# Patient Record
Sex: Male | Born: 1950 | Race: White | Hispanic: No | Marital: Married | Smoking: Never smoker
Health system: Southern US, Community
[De-identification: ages and names within clinical notes are randomized; demographics above are authoritative.]

## PROBLEM LIST (undated history)

## (undated) DIAGNOSIS — I10 Essential (primary) hypertension: Secondary | ICD-10-CM

## (undated) DIAGNOSIS — C801 Malignant (primary) neoplasm, unspecified: Secondary | ICD-10-CM

---

## 2004-04-20 ENCOUNTER — Ambulatory Visit (HOSPITAL_COMMUNITY): Admission: RE | Admit: 2004-04-20 | Discharge: 2004-04-20 | Payer: Self-pay | Admitting: Internal Medicine

## 2004-05-09 ENCOUNTER — Inpatient Hospital Stay (HOSPITAL_COMMUNITY): Admission: RE | Admit: 2004-05-09 | Discharge: 2004-05-14 | Payer: Self-pay | Admitting: General Surgery

## 2004-06-22 ENCOUNTER — Encounter (HOSPITAL_COMMUNITY): Admission: RE | Admit: 2004-06-22 | Discharge: 2004-07-22 | Payer: Self-pay | Admitting: Oncology

## 2004-06-22 ENCOUNTER — Encounter: Admission: RE | Admit: 2004-06-22 | Discharge: 2004-06-22 | Payer: Self-pay | Admitting: Oncology

## 2007-10-15 ENCOUNTER — Ambulatory Visit: Payer: Self-pay | Admitting: Cardiology

## 2007-11-13 ENCOUNTER — Ambulatory Visit: Payer: Self-pay | Admitting: Cardiology

## 2007-12-12 ENCOUNTER — Ambulatory Visit: Payer: Self-pay | Admitting: Cardiology

## 2007-12-19 ENCOUNTER — Ambulatory Visit: Payer: Self-pay | Admitting: Cardiology

## 2011-03-14 NOTE — Assessment & Plan Note (Signed)
[facility]                          [facility] OFFICE NOTE   ARIS, EVEN                       MRN:          [id number]  DATE:12/12/2007                            DOB:          16-Dec-1950    CARDIOLOGIST:  Dr. Simona Huh.   PRIMARY CARE PHYSICIAN:  Dr. Samuel Jester.   REASON FOR VISIT:  88-month follow-up.   HISTORY OF PRESENT ILLNESS:  Mr. Tiger is a 60 year old male patient  with a history of hypertension, hyperlipidemia and colon cancer who saw  Dr. Diona Browner in December 2008.  At that point he was evaluated initially  for dyspnea.  He was set up for stress Cardiolite study as well as an  echocardiogram to further evaluate.  Unfortunately, the echocardiogram  was never performed.  His Cardiolite study was an adenosine study and  revealed an EF of 46%, global hypokinesis, medium fixed mid to basal  inferior defect associated mild hypokinesis consistent with prior  myocardial infarction.   The patient returns the office today for follow-up.  He continues note  progressively worsening edema in his lower extremities and continues to  note dyspnea with exertion.  He describes NYHA class 2B symptoms.  He  denies any orthopnea, PND.  Denies any syncope, near-syncope or  palpitations.  He denies chest pain.   CURRENT MEDICATIONS:  Include Exforge 10/320 mg daily, ranitidine 150 mg  b.i.d., alprazolam 1 mg three times a day.  Atenolol 50 mg daily,  Seroquel 100 mg daily.  The patient has been prescribed Bystolic 5 mg  daily by Dr. Charm Barges in the past but he prefers to take atenolol.  He was  taking atenolol but ran out of this and right now he is taking Bystolic  until he gets his new prescription of atenolol filled.  I explained to  him that he is to take one or the other but not both.   ALLERGIES:  No known drug allergies.   SOCIAL HISTORY:  Denies any tobacco abuse.   PHYSICAL EXAM:  He is a well-nourished, well-developed male,  in no acute  distress.  Blood pressure is 138/82, pulse 66 weight 215 pounds.  HEENT is normal.  Without JVD.  CARDIAC:  Normal S1, S2.  Regular rate and rhythm.  No murmurs.  LUNGS:  Clear to auscultation bilaterally.  No wheezing, rhonchi or  rales.  ABDOMEN:  Soft, nontender with normoactive bowel sounds.  No  organomegaly.  No HJR noted.  Extremities with 3+ edema bilaterally from his ankles up to his knees.  No presacral edema noted.  The patient denies scrotal edema.   IMPRESSION:  1. Dyspnea with exertion.  Abnormal adenosine Cardiolite January 2009      revealing EF of 46% and prior inferior myocardial infarction.  2. Peripheral edema.  3. Hypertension.  4. Hyperlipidemia.  5. Colon cancer.  Status post right hemicolectomy.  Recent evidence of      colonic polyps noted - the patient needs repeat colonoscopy;      pending.  6. Family history of CAD.   DISCUSSION:  Mr. Xia returns office  day for follow-up on his  Cardiolite study.  This was abnormal with evidence of an EF of 46% and  prior inferior scar.  He clearly has evidence of volume overload on exam  with significant pedal edema.  It is certainly possible that some of his  edema may be explained by amlodipine.  However, he does have dyspnea  with exertion.  His lungs are clear.  He is certainly at risk for CAD.  I discussed the case further with Dr. Diona Browner.   PLAN:  1. Initiate Lasix 40 mg daily and K-Dur 20 mEq daily.  2. Recheck a CMET, CBC, TSH, urinalysis and chest x-ray.  3. Repeat BMET in one weeks' time to reassess renal function and      potassium.  4. Proceed with 2-D echocardiogram to assess his LV function, valvular      status, right-sided heart pressures.  5. We will see him back in 3 to 4 weeks to follow-up with Dr. Diona Browner      and myself.  If he has evidence of LV dysfunction on 2-D      echocardiogram confirmed, he will likely need to proceed to cardiac      catheterization.  6. He has been  asked to start enteric coated aspirin 81 mg daily.      Tereso Newcomer, PA-C  Electronically Signed      Jonelle Sidle, MD  Electronically Signed   SW/MedQ  DD: 12/12/2007  DT: 12/13/2007  Job #: [id number]   cc:   Samuel Jester

## 2011-03-14 NOTE — Assessment & Plan Note (Signed)
[facility]                          [facility]ICE NOTE   Troy Terry, Troy Terry                       MRN:          [id number]  DATE:10/15/2007                            DOB:          [birth year]    REASON FOR CONSULTATION:  History of chest pain and shortness of breath.   HISTORY OF PRESENT ILLNESS:  Troy Terry is a 60 year old male with  available history suggesting longstanding hypertension, hyperlipidemia  with previous LDL cholesterol of 115 noted in September of this year,  and no definite history of cardiovascular disease or myocardial  infarction.  He reports a fairly longstanding several year history of  dyspnea on exertion.  He states that he becomes short of breath when he  walks 20 yards to his mailbox although does not have to stop.  He has no  typical exertional chest pain, describing only sporadic episodes of  brief (just a few seconds) dull chest discomfort.  This has not been  noted in any type of progressive fashion.  He states that he has been  under quite a bit of stress at home and also states that he has been  feeling fatigued associated with decreased energy, decreased appetite  and some anhedonia.  He does report that he has been treated for  depression by Dr. Charm Barges.  Today his electrocardiogram shows sinus  rhythm with nonspecific T wave changes and decreased anterior R wave  progression, although no frank Q waves.   ALLERGIES:  NO KNOWN DRUG ALLERGIES.   PRESENT MEDICATIONS:  1. Exforge 10/320 mg p.o. daily.  2. Ranitidine 150 mg p.o. b.i.d.  3. Alprazolam 1 mg p.o. t.i.d.  4. Atenolol 50 mg p.o. daily.  5. Phenergan.  6. Percocet p.r.n.   PAST MEDICAL HISTORY:  As outlined above.  Patient reports appendectomy  in 1995, also right hemicolectomy with removal of cancerous cecal mass  in 2005.  He reports that he has some other recurrent areas that were  noted recently following a double contrast barium study.   He reports  that he is to see Dr. Gabriel Cirri.  Also reports history of previous back  fractures and sleep apnea.   SOCIAL HISTORY:  Patient is married, he has 2 children.  He is disabled  since 23.  Denies any active tobacco or alcohol use.  Does not  exercise regularly.   FAMILY HISTORY:  Was reviewed, significant for heart disease in the  patient's father beginning in his 68s, he died at age 22 with diabetes  and heart disease.  Patient's mother is alive, age 37, living in a  nursing home.  He does have a brother that died at age 29 with cancer.   REVIEW OF SYSTEMS:  As described in the history of present illness.  Patient reports occasional reflux, arthritic pain, anxiety, depression,  seasonal allergies, constipation, hiatal hernia.   EXAMINATION:  Blood pressure is 150/90, heart rate is 60 and regular,  weight is 210 pounds.  He is an overweight male appearing older than his  stated age, no acute distress.  HEENT:  Conjunctivae and lids  grossly normal, oropharynx clear.  NECK:  Supple, no elevated jugular venous pressure or loud bruits, no  thyromegaly.  LUNGS:  Generally clear without labored breathing or wheezing.  CARDIAC:  Reveals a regular rate and rhythm, no loud systolic murmur or  S3 gallop, no pericardial rub.  ABDOMEN:  Soft, nontender, normoactive bowel sounds.  EXTREMITIES:  Exhibit fairly chronic, firm appearing edema, 2+.  Distal  pulse are 2+.  SKIN:  Otherwise warm and dry.  MUSCULOSKELETAL:  No kyphosis is noted.  NEUROPSYCHIATRIC:  Patient is alert and oriented x3.   IMPRESSION AND RECOMMENDATION:  1. Atypical chest pain with longer standing dyspnea on exertion in a      60 year old male with apparent longstanding hypertension, family      history of premature cardiovascular disease and hyperlipidemia.      Electrocardiogram is non specific.  He has not undergone any prior      cardiac risk stratification and our plan will be to proceed with an       adenosine Cardiolite and a 2D echocardiogram for further      evaluation.  I will then have him come back to the office for      further discussion and review.  2. Further plans to follow.     Jonelle Sidle, MD  Electronically Signed    SGM/MedQ  DD: 10/15/2007  DT: 10/15/2007  Job #: [id number]

## 2011-03-17 NOTE — H&P (Signed)
Troy Terry, FREDIN                          ACCOUNT NO.:  [id number]   MEDICAL RECORD NO.:  [id number]                  PATIENT TYPE:  AMB   LOCATION:                                       FACILITY:  [facility]   PHYSICIAN:  Dalia Heading, M.D.               DATE OF BIRTH:  15-Feb-1951   DATE OF ADMISSION:  DATE OF DISCHARGE:                                HISTORY & PHYSICAL   CHIEF COMPLAINT/>  Cecal neoplasm.   HISTORY OF PRESENT ILLNESS:  Patient is a 60 year old white male who is  referred for evaluation and treatment of a cecal neoplasm.  He underwent  colonoscopy by Dr. Kendell Bane for Hemoccult positive stools.  He was found to  have a neoplasm in the cecum.  Biopsy results were negative for malignancy.  The neoplasm could not be removed with the colonoscopy.  No family history  of colon carcinoma is noted.  No weight loss or bowel pain is noted.  No  diarrhea, constipation, bloating, nausea or vomiting have been noted.   PAST MEDICAL HISTORY:  Includes hypertension.   PAST SURGICAL HISTORY:  Appendectomy.   CURRENT MEDICATIONS:  1. Lotrel 10/20 mg p.o. q.d.  2. Diovan 165 mg p.o. q.d.  3. Atenolol 50 mg p.o. q.d.  4. K-Dur 10 mEq p.o. q.d.  5. Percocet as needed.  6. Xanax as needed.   ALLERGIES:  CODEINE.   REVIEW OF SYSTEMS:  Noncontributory.   PHYSICAL EXAMINATION:  VITAL SIGNS:  He is afebrile.  Vital signs are  stable.  GENERAL:  Patient is a well-developed and well-nourished white male in no  acute distress.  NECK:  Supple without lymphadenopathy.  LUNGS:  Clear to auscultation with equal breath sounds bilaterally.  HEART:  Regular rate and rhythm without S3, S4, or murmurs.  ABDOMEN:  Soft, nontender, nondistended.  No hepatosplenomegaly, masses, or  hernias.   IMPRESSION:  Cecal neoplasm, undetermined nature.   PLAN:  Patient is scheduled for a right hemicolectomy on May 09, 2004.  The  risks and benefits of the procedure, including bleeding, infection,  the  possibility of a blood transfusion, and the possibility of cardiopulmonary  difficulties were fully explained to the patient, who gave informed consent.     ___________________________________________                                         Dalia Heading, M.D.   MAJ/MEDQ  D:  04/21/2004  T:  04/21/2004  Job:  [id number]   cc:   R. Roetta Sessions, M.D.  [address]  [place]  [place] [address]  Fax: [phone]   [address]  [place]  [place] [address]  Fax: [phone]

## 2011-03-17 NOTE — Op Note (Signed)
NAMESANTIAGO, Troy Terry                          ACCOUNT NO.:  [id number]   MEDICAL RECORD NO.:  [id number]                   PATIENT TYPE:  AMB   LOCATION:  DAY                                  FACILITY:  [facility]   PHYSICIAN:  R. Roetta Sessions, M.D.              DATE OF BIRTH:  1951-01-15   DATE OF PROCEDURE:  04/20/2004  DATE OF DISCHARGE:                                 OPERATIVE REPORT   PROCEDURE:  Colonoscopy with snare polypectomy with biopsy.   INDICATIONS FOR PROCEDURE:  The patient is a 60 year old gentleman, referred  for colonoscopy.  Apparently, he was Hemoccult positive on digital rectal  exam in the emergency department recently when he was evaluated for  hypokalemia.  He has never had his lower GI tract evaluated.  There is no  family history of colorectal neoplasia.  Colonoscopy is now being done.  This approach has been discussed with the patient at length.  Potential  risks, benefits, and alternatives have been reviewed, questions answered.  Please see my handwritten H&P.   PROCEDURE NOTE:  O2 saturations, blood pressure, pulse, respirations were  monitored throughout the entire procedure.   CONSCIOUS SEDATION:  1. Versed 6 mg IV.  2. Demerol 100 IV in divided doses.   INSTRUMENT:  Olympus video chip system.   FINDINGS:  Digital rectal exam revealed no abnormalities.  Prep was good.   RECTUM:  Examination of rectal mucosa including retroflexed view of the anal  verge revealed no abnormalities.   COLON:  Colonic mucosa was surveyed from the rectosigmoid junction through  the left transverse and right colon to the area of appendiceal orifice,  ileocecal valve, and cecum.  The appendiceal orifice was well seen and  photographed.  From this leve, the scope was slowly withdrawn.  All  previously mentioned mucosal surfaces were again seen.  The following  abnormalities were found:  1. Left-sided transverse diverticula.  2. 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  Rough     dimensions would be approximately 5 x 7 cm.  The ileocecal valve itself     was obscured by the infiltrating tumor.  There were also numerous polyps     in the right colon with the largest being 1.5 cm at the hepatic flexure.     The neoplasm was biopsied multiple times.  It was hard and friable.     Multiple polyps in the right colon were either cold or hot snared.  Not     all of the polyp fragments were recovered.  However, there was no     evidence of polyp or neoplastic disease distal to the hepatic flexure.     The remainder of the colonic mucosa appeared normal.  The patient     tolerated the procedure well and was reacted in endoscopy.   IMPRESSION:  1. Normal rectum.  2. Left-sided transverse diverticula.  3.  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  4. Multiple right colon polyps with largest polyp at the hepatic flexure     removed with the snare.   RECOMMENDATIONS:  1. The patient will need to get his right colon out in the near future.  We     will discuss surgical consultation with him.  2. Baseline CEA and chem-20.  3. CBC today.  4. Further recommendations to follow.      ___________________________________________                                            Troy Terry, M.D.   RMR/MEDQ  D:  04/20/2004  T:  04/20/2004  Job:  [id number]   cc:   [address]  [place]  [place] [address]  Fax: [phone]

## 2011-03-17 NOTE — Op Note (Signed)
Troy Terry, Troy Terry                          ACCOUNT NO.:  [id number]   MEDICAL RECORD NO.:  [id number]                   PATIENT TYPE:  AMB   LOCATION:  DAY                                  FACILITY:  [facility]   PHYSICIAN:  Dalia Heading, M.D.               DATE OF BIRTH:  [birth year]   DATE OF PROCEDURE:  05/09/2004  DATE OF DISCHARGE:                                 OPERATIVE REPORT   PREOPERATIVE DIAGNOSIS:  Cecal mass, umbilical hernia.   POSTOPERATIVE DIAGNOSIS:  Cecal mass, umbilical hernia.   PROCEDURE:  Right hemicolectomy, umbilical herniorrhaphy.   SURGEON:  Dr. Franky Macho.   ANESTHESIA:  General endotracheal.   INDICATIONS:  The patient is a 60 year old white male who was found to have  a cecal mass on colonoscopy by Dr. Loura Back.  Initial biopsies were negative for  malignancy.  Due to the size of the mass and the inability to excise it  endoscopically, the patient now comes to the operating room for a right  hemicolectomy with terminal ileum removal.  In addition, he has an umbilical  hernia which is symptomatic.  This will also be repaired at the time of  surgery.  The risks and benefits of the procedures including bleeding,  infection, the possibility of blood transfusion, cardiopulmonary  difficulties, and a strong possibility that this is a malignancy were fully  explained to the patient, gaining informed consent.   PROCEDURE:  The patient was placed in the supine position.  After induction  of general endotracheal anesthesia, the abdomen was prepped and draped using  the usual sterile technique with Betadine.  Surgical site confirmation was  performed.   A midline incision was made from the umbilicus inferiorly.  This was taken  down to the fascia.  The peritoneal cavity was entered without difficulty.  The liver, stomach, spleen, gallbladder and small intestine were all within  normal limits.  A palpable mass was noted within the cecal region.  The rest  of  the colon was within normal limits.  The right colon was mobilized around  the line of Toldt.  The mobilization occurred around to the middle colic  artery.  A GIA stapler was placed across the terminal ileum and fired.  This  was likewise done to the proximal transverse colon.  The right colon  mesentery was divided and ligated using 2-0 silk ties as well as an LDS  stapler.  The specimen was sent to pathology for further examination.  A  side-to-side ileocolic anastomosis was then performed using a GIA 70  stapler.  The enterotomy was closed using a TA 60 stapler.  The staple line  was bolstered using 3-0 silk Lembert sutures.  A widely patent anastomosis  was found.  The mesenteric defect was closed using an 0 chromic gut running  suture.  The abdominal cavity was then copiously irrigated with normal  saline.  Any  bleeding was controlled using Bovie electrocautery and clips.  The bowel was returned into the abdominal cavity in an orderly fashion.   All surgical personnel then changed their gloves.  The fascia was  reapproximated using a looped 0 Novofil running suture.  The umbilical  hernia was repaired using an 0 Novofil interrupted suture.  The subcutaneous  layer was irrigated with normal saline and the skin was closed using  staples.  Betadine ointment and a dry sterile dressing were applied.   All tape and needle counts were correct at the end of the procedure.  The  patient was extubated in the operating room and went back to the recovery  room awake and in stable condition.   COMPLICATIONS:  None.   SPECIMENS:  Right colon.   BLOOD LOSS:  300 cc.      ___________________________________________                                            Dalia Heading, M.D.   MAJ/MEDQ  D:  05/09/2004  T:  05/09/2004  Job:  [id number]   cc:   [address]  [place]  [place] [address]  Fax: [phone]   R. Roetta Sessions, M.D.  [address]  [place]  [place] [address]  Fax:  [phone]

## 2011-03-17 NOTE — Discharge Summary (Signed)
Troy Terry, Troy Terry                          ACCOUNT NO.:  [id number]   MEDICAL RECORD NO.:  [id number]                   PATIENT TYPE:  INP   LOCATION:  A306                                 FACILITY:  [facility]   PHYSICIAN:  Dalia Heading, M.D.               DATE OF BIRTH:  [birth year]   DATE OF ADMISSION:  05/09/2004  DATE OF DISCHARGE:  05/14/2004                                 DISCHARGE SUMMARY   HOSPITAL COURSE SUMMARY:  Patient is a 60 year old white male who was found  on colonoscopy by Dr. Augusto Gamble to have a cecal mass.  Initial biopsies  were negative for malignancy.  Due to the size of the mass, the patient was  referred to my care for a right hemicolectomy.  This was performed on May 09, 2004.  His postoperative course was remarkable for anemia.  He did  require several blood transfusions.  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  Thirteen lymph nodes were negative for  malignancy.  Staging was at T2, N0, M0.  The patient's diet was advanced  without difficulty once his bowel function returned.  His preoperative CEA  was within normal limits.   Patient is being discharged home on May 14, 2004 in good improving  condition.   DISCHARGE INSTRUCTIONS:  Patient will follow up with Dr. Franky Macho on  May 17, 2004.   DISCHARGE MEDICATIONS INCLUDE:  1. Percocet one tablet p.o. q.6h. p.r.n. pain.  2. He is to resume all his other medications as previously prescribed.   PRINCIPAL DIAGNOSES:  1. Adenocarcinoma of the cecum.  2. Hypertension.  3. Anemia, resolved.  4. Anxiety.   PRINCIPAL PROCEDURE:  Right hemicolectomy by Dr. Franky Macho on May 09, 2004.     ___________________________________________                                         Dalia Heading, M.D.   MAJ/MEDQ  D:  05/14/2004  T:  05/14/2004  Job:  [id number]   cc:   [address]  [place]  [place] [address]  Fax: [phone]   R. Roetta Sessions, M.D.  [address]  [place]  [place] [address]  Fax: [phone]

## 2014-08-01 ENCOUNTER — Telehealth: Payer: Self-pay | Admitting: Cardiology
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
## 2014-10-12 ENCOUNTER — Emergency Department (HOSPITAL_COMMUNITY): Payer: PRIVATE HEALTH INSURANCE

## 2014-10-12 ENCOUNTER — Observation Stay (HOSPITAL_COMMUNITY)
Admission: EM | Admit: 2014-10-12 | Discharge: 2014-10-14 | Disposition: A | Discharge status: Home / Self Care | Payer: PRIVATE HEALTH INSURANCE | Attending: Internal Medicine | Admitting: Internal Medicine

## 2014-10-12 ENCOUNTER — Encounter (HOSPITAL_COMMUNITY): Payer: Self-pay

## 2014-10-12 DIAGNOSIS — E876 Hypokalemia: Secondary | ICD-10-CM | POA: Insufficient documentation

## 2014-10-12 DIAGNOSIS — Z79899 Other long term (current) drug therapy: Secondary | ICD-10-CM | POA: Diagnosis not present

## 2014-10-12 DIAGNOSIS — Y92410 Unspecified street and highway as the place of occurrence of the external cause: Secondary | ICD-10-CM | POA: Diagnosis not present

## 2014-10-12 DIAGNOSIS — Y998 Other external cause status: Secondary | ICD-10-CM | POA: Insufficient documentation

## 2014-10-12 DIAGNOSIS — Z85038 Personal history of other malignant neoplasm of large intestine: Secondary | ICD-10-CM | POA: Insufficient documentation

## 2014-10-12 DIAGNOSIS — R55 Syncope and collapse: Principal | ICD-10-CM | POA: Diagnosis present

## 2014-10-12 DIAGNOSIS — Y9389 Activity, other specified: Secondary | ICD-10-CM | POA: Diagnosis not present

## 2014-10-12 DIAGNOSIS — I10 Essential (primary) hypertension: Secondary | ICD-10-CM

## 2014-10-12 DIAGNOSIS — S29001A Unspecified injury of muscle and tendon of front wall of thorax, initial encounter: Secondary | ICD-10-CM | POA: Diagnosis not present

## 2014-10-12 DIAGNOSIS — G8929 Other chronic pain: Secondary | ICD-10-CM | POA: Insufficient documentation

## 2014-10-12 DIAGNOSIS — F319 Bipolar disorder, unspecified: Secondary | ICD-10-CM | POA: Diagnosis not present

## 2014-10-12 HISTORY — DX: Essential (primary) hypertension: I10

## 2014-10-12 HISTORY — DX: Malignant (primary) neoplasm, unspecified: C80.1

## 2014-10-12 LAB — COMPREHENSIVE METABOLIC PANEL
ALK PHOS: 77 U/L (ref 39–117)
ALT: 7 U/L (ref 0–53)
AST: 12 U/L (ref 0–37)
Albumin: 3.7 g/dL (ref 3.5–5.2)
Anion gap: 13 (ref 5–15)
BILIRUBIN TOTAL: 0.7 mg/dL (ref 0.3–1.2)
BUN: 8 mg/dL (ref 6–23)
CALCIUM: 9 mg/dL (ref 8.4–10.5)
CHLORIDE: 97 meq/L (ref 96–112)
CO2: 30 meq/L (ref 19–32)
Creatinine, Ser: 1.12 mg/dL (ref 0.50–1.35)
GFR, EST AFRICAN AMERICAN: 78 mL/min — AB (ref >90)
GFR, EST NON AFRICAN AMERICAN: 68 mL/min — AB (ref >90)
Glucose, Bld: 120 mg/dL — ABNORMAL HIGH (ref 70–99)
POTASSIUM: 2.6 meq/L — AB (ref 3.7–5.3)
Sodium: 140 mEq/L (ref 137–147)
Total Protein: 6.8 g/dL (ref 6.0–8.3)

## 2014-10-12 LAB — URINALYSIS, ROUTINE W REFLEX MICROSCOPIC
Bilirubin Urine: NEGATIVE
GLUCOSE, UA: NEGATIVE mg/dL
HGB URINE DIPSTICK: NEGATIVE
KETONES UR: NEGATIVE mg/dL
Leukocytes, UA: NEGATIVE
NITRITE: NEGATIVE
PH: 6 (ref 5.0–8.0)
PROTEIN: NEGATIVE mg/dL
Specific Gravity, Urine: 1.012 (ref 1.005–1.030)
UROBILINOGEN UA: 0.2 mg/dL (ref 0.0–1.0)

## 2014-10-12 LAB — CBC WITH DIFFERENTIAL/PLATELET
Basophils Absolute: 0 10*3/uL (ref 0.0–0.1)
Basophils Relative: 1 % (ref 0–1)
EOS ABS: 0.1 10*3/uL (ref 0.0–0.7)
EOS PCT: 2 % (ref 0–5)
HEMATOCRIT: 34.2 % — AB (ref 39.0–52.0)
HEMOGLOBIN: 11.8 g/dL — AB (ref 13.0–17.0)
LYMPHS PCT: 30 % (ref 12–46)
Lymphs Abs: 1.8 10*3/uL (ref 0.7–4.0)
MCH: 31.2 pg (ref 26.0–34.0)
MCHC: 34.5 g/dL (ref 30.0–36.0)
MCV: 90.5 fL (ref 78.0–100.0)
Monocytes Absolute: 0.5 10*3/uL (ref 0.1–1.0)
Monocytes Relative: 8 % (ref 3–12)
NEUTROS ABS: 3.7 10*3/uL (ref 1.7–7.7)
Neutrophils Relative %: 59 % (ref 43–77)
PLATELETS: 261 10*3/uL (ref 150–400)
RBC: 3.78 MIL/uL — AB (ref 4.22–5.81)
RDW: 12.4 % (ref 11.5–15.5)
WBC: 6.1 10*3/uL (ref 4.0–10.5)

## 2014-10-12 LAB — TROPONIN I: Troponin I: <0.3 ng/mL (ref <0.30)

## 2014-10-12 LAB — RAPID URINE DRUG SCREEN, HOSP PERFORMED
Amphetamines: NOT DETECTED
Barbiturates: NOT DETECTED
Benzodiazepines: POSITIVE — AB
COCAINE: NOT DETECTED
OPIATES: NOT DETECTED
Tetrahydrocannabinol: NOT DETECTED

## 2014-10-12 LAB — TSH: TSH: 4.61 u[IU]/mL — AB (ref 0.350–4.500)

## 2014-10-12 LAB — CBG MONITORING, ED: GLUCOSE-CAPILLARY: 116 mg/dL — AB (ref 70–99)

## 2014-10-12 LAB — PHOSPHORUS: PHOSPHORUS: 3 mg/dL (ref 2.3–4.6)

## 2014-10-12 LAB — MAGNESIUM: MAGNESIUM: 1.8 mg/dL (ref 1.5–2.5)

## 2014-10-12 MED ORDER — PANTOPRAZOLE SODIUM 40 MG PO TBEC
40.0000 mg | DELAYED_RELEASE_TABLET | Freq: Every day | ORAL | Status: DC
  Administered 2014-10-13 – 2014-10-14 (×2): 40 mg via ORAL
  Filled 2014-10-12 (×2): qty 1

## 2014-10-12 MED ORDER — MORPHINE SULFATE 2 MG/ML IJ SOLN
1.0000 mg | INTRAMUSCULAR | Status: DC | PRN
  Administered 2014-10-13 (×3): 1 mg via INTRAVENOUS
  Filled 2014-10-12 (×3): qty 1

## 2014-10-12 MED ORDER — DOCUSATE SODIUM 100 MG PO CAPS
100.0000 mg | ORAL_CAPSULE | Freq: Two times a day (BID) | ORAL | Status: DC
  Administered 2014-10-14: 100 mg via ORAL
  Filled 2014-10-12 (×3): qty 1

## 2014-10-12 MED ORDER — VITAMIN B-1 100 MG PO TABS
100.0000 mg | ORAL_TABLET | Freq: Every day | ORAL | Status: DC
  Administered 2014-10-13 – 2014-10-14 (×2): 100 mg via ORAL
  Filled 2014-10-12 (×2): qty 1

## 2014-10-12 MED ORDER — OLANZAPINE 10 MG PO TABS
10.0000 mg | ORAL_TABLET | Freq: Every day | ORAL | Status: DC
  Filled 2014-10-12: qty 1

## 2014-10-12 MED ORDER — LEVOTHYROXINE SODIUM 25 MCG PO TABS
25.0000 ug | ORAL_TABLET | Freq: Every day | ORAL | Status: DC
  Administered 2014-10-13 – 2014-10-14 (×2): 25 ug via ORAL
  Filled 2014-10-12 (×2): qty 1

## 2014-10-12 MED ORDER — ACETAMINOPHEN 500 MG PO TABS
1000.0000 mg | ORAL_TABLET | Freq: Once | ORAL | Status: AC
  Administered 2014-10-12: 1000 mg via ORAL
  Filled 2014-10-12: qty 2

## 2014-10-12 MED ORDER — POTASSIUM CHLORIDE CRYS ER 20 MEQ PO TBCR
40.0000 meq | EXTENDED_RELEASE_TABLET | Freq: Once | ORAL | Status: AC
  Administered 2014-10-12: 40 meq via ORAL
  Filled 2014-10-12: qty 2

## 2014-10-12 MED ORDER — ASPIRIN EC 325 MG PO TBEC
325.0000 mg | DELAYED_RELEASE_TABLET | Freq: Every day | ORAL | Status: DC
  Administered 2014-10-12 – 2014-10-14 (×3): 325 mg via ORAL
  Filled 2014-10-12 (×3): qty 1

## 2014-10-12 MED ORDER — CLONIDINE HCL 0.2 MG PO TABS
0.2000 mg | ORAL_TABLET | Freq: Two times a day (BID) | ORAL | Status: DC
  Administered 2014-10-12 – 2014-10-14 (×4): 0.2 mg via ORAL
  Filled 2014-10-12 (×4): qty 1

## 2014-10-12 MED ORDER — SODIUM CHLORIDE 0.9 % IV SOLN
INTRAVENOUS | Status: DC
  Administered 2014-10-12: 22:00:00 via INTRAVENOUS

## 2014-10-12 MED ORDER — CARVEDILOL 25 MG PO TABS
25.0000 mg | ORAL_TABLET | Freq: Two times a day (BID) | ORAL | Status: DC
  Administered 2014-10-13 – 2014-10-14 (×3): 25 mg via ORAL
  Filled 2014-10-12 (×3): qty 1

## 2014-10-12 MED ORDER — SODIUM CHLORIDE 0.9 % IJ SOLN: 3.0000 mL | Freq: Two times a day (BID) | INTRAMUSCULAR | Status: DC

## 2014-10-12 MED ORDER — IRBESARTAN 150 MG PO TABS
300.0000 mg | ORAL_TABLET | Freq: Every day | ORAL | Status: DC
  Administered 2014-10-13 – 2014-10-14 (×2): 300 mg via ORAL
  Filled 2014-10-12 (×2): qty 2

## 2014-10-12 MED ORDER — POTASSIUM CHLORIDE CRYS ER 20 MEQ PO TBCR
20.0000 meq | EXTENDED_RELEASE_TABLET | Freq: Two times a day (BID) | ORAL | Status: AC
  Administered 2014-10-12 – 2014-10-13 (×3): 20 meq via ORAL
  Filled 2014-10-12 (×2): qty 1
  Filled 2014-10-12 (×2): qty 2

## 2014-10-12 MED ORDER — ONDANSETRON HCL 4 MG/2ML IJ SOLN
4.0000 mg | Freq: Four times a day (QID) | INTRAMUSCULAR | Status: DC | PRN
  Administered 2014-10-12: 4 mg via INTRAVENOUS
  Filled 2014-10-12: qty 2

## 2014-10-12 MED ORDER — ADULT MULTIVITAMIN W/MINERALS CH
1.0000 | ORAL_TABLET | Freq: Every day | ORAL | Status: DC
  Administered 2014-10-14: 1 via ORAL
  Filled 2014-10-12 (×2): qty 1

## 2014-10-12 MED ORDER — FOLIC ACID 1 MG PO TABS
1.0000 mg | ORAL_TABLET | Freq: Every day | ORAL | Status: DC
  Administered 2014-10-13 – 2014-10-14 (×2): 1 mg via ORAL
  Filled 2014-10-12 (×2): qty 1

## 2014-10-12 MED ORDER — PROMETHAZINE HCL 25 MG PO TABS: 25.0000 mg | ORAL_TABLET | Freq: Two times a day (BID) | ORAL | Status: DC | PRN

## 2014-10-12 MED ORDER — POTASSIUM CHLORIDE 10 MEQ/100ML IV SOLN
10.0000 meq | Freq: Once | INTRAVENOUS | Status: AC
  Administered 2014-10-12: 10 meq via INTRAVENOUS
  Filled 2014-10-12: qty 100

## 2014-10-12 MED ORDER — FLUOXETINE HCL 20 MG PO CAPS
40.0000 mg | ORAL_CAPSULE | Freq: Every day | ORAL | Status: DC
  Administered 2014-10-13 – 2014-10-14 (×2): 40 mg via ORAL
  Filled 2014-10-12 (×4): qty 2

## 2014-10-12 MED ORDER — ACETAMINOPHEN 325 MG PO TABS: 650.0000 mg | ORAL_TABLET | Freq: Four times a day (QID) | ORAL | Status: DC | PRN

## 2014-10-12 MED ORDER — ALPRAZOLAM 0.5 MG PO TABS
1.0000 mg | ORAL_TABLET | Freq: Four times a day (QID) | ORAL | Status: DC | PRN
  Administered 2014-10-13 (×2): 1 mg via ORAL
  Filled 2014-10-12 (×2): qty 2

## 2014-10-12 MED ORDER — HEPARIN SODIUM (PORCINE) 5000 UNIT/ML IJ SOLN
5000.0000 [IU] | Freq: Three times a day (TID) | INTRAMUSCULAR | Status: DC
  Administered 2014-10-12 – 2014-10-13 (×3): 5000 [IU] via SUBCUTANEOUS
  Filled 2014-10-12 (×3): qty 1
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
## 2014-10-12 NOTE — ED Notes (Signed)
CBG 116  

## 2014-10-12 NOTE — ED Notes (Signed)
Returned from radiology. Alert and oriented. Continues to c/o left upper rib pain. resp e/u

## 2014-10-12 NOTE — ED Notes (Signed)
Pt. Moved to [address] bed. Pt.verbalized understanding.

## 2014-10-13 ENCOUNTER — Encounter (HOSPITAL_COMMUNITY): Payer: Self-pay | Admitting: *Deleted

## 2014-10-13 DIAGNOSIS — G8929 Other chronic pain: Secondary | ICD-10-CM | POA: Diagnosis not present

## 2014-10-13 DIAGNOSIS — E876 Hypokalemia: Secondary | ICD-10-CM | POA: Diagnosis not present

## 2014-10-13 DIAGNOSIS — R55 Syncope and collapse: Secondary | ICD-10-CM | POA: Diagnosis not present

## 2014-10-13 DIAGNOSIS — I517 Cardiomegaly: Secondary | ICD-10-CM

## 2014-10-13 DIAGNOSIS — F319 Bipolar disorder, unspecified: Secondary | ICD-10-CM | POA: Diagnosis not present

## 2014-10-13 LAB — COMPREHENSIVE METABOLIC PANEL
ALK PHOS: 79 U/L (ref 39–117)
ALT: 6 U/L (ref 0–53)
ANION GAP: 9 (ref 5–15)
AST: 8 U/L (ref 0–37)
Albumin: 3.1 g/dL — ABNORMAL LOW (ref 3.5–5.2)
BILIRUBIN TOTAL: 0.7 mg/dL (ref 0.3–1.2)
BUN: 7 mg/dL (ref 6–23)
CHLORIDE: 102 meq/L (ref 96–112)
CO2: 30 meq/L (ref 19–32)
Calcium: 8.4 mg/dL (ref 8.4–10.5)
Creatinine, Ser: 0.89 mg/dL (ref 0.50–1.35)
GFR, EST NON AFRICAN AMERICAN: 88 mL/min — AB (ref >90)
GLUCOSE: 102 mg/dL — AB (ref 70–99)
POTASSIUM: 3.3 meq/L — AB (ref 3.7–5.3)
SODIUM: 141 meq/L (ref 137–147)
Total Protein: 5.8 g/dL — ABNORMAL LOW (ref 6.0–8.3)

## 2014-10-13 LAB — TROPONIN I
Troponin I: <0.3 ng/mL (ref <0.30)
Troponin I: <0.3 ng/mL (ref <0.30)

## 2014-10-13 LAB — CBC
HCT: 31.4 % — ABNORMAL LOW (ref 39.0–52.0)
Hemoglobin: 10.8 g/dL — ABNORMAL LOW (ref 13.0–17.0)
MCH: 31 pg (ref 26.0–34.0)
MCHC: 34.4 g/dL (ref 30.0–36.0)
MCV: 90.2 fL (ref 78.0–100.0)
Platelets: 215 10*3/uL (ref 150–400)
RBC: 3.48 MIL/uL — ABNORMAL LOW (ref 4.22–5.81)
RDW: 12.5 % (ref 11.5–15.5)
WBC: 4.8 10*3/uL (ref 4.0–10.5)

## 2014-10-13 LAB — HEMOGLOBIN A1C
Hgb A1c MFr Bld: 5.7 % — ABNORMAL HIGH (ref <5.7)
Mean Plasma Glucose: 117 mg/dL — ABNORMAL HIGH (ref <117)

## 2014-10-13 LAB — GLUCOSE, CAPILLARY: GLUCOSE-CAPILLARY: 98 mg/dL (ref 70–99)

## 2014-10-13 MED ORDER — MAGNESIUM SULFATE 2 GM/50ML IV SOLN
2.0000 g | Freq: Once | INTRAVENOUS | Status: AC
  Administered 2014-10-13: 2 g via INTRAVENOUS
  Filled 2014-10-13: qty 50

## 2014-10-13 MED ORDER — SODIUM CHLORIDE 0.9 % IJ SOLN: 3.0000 mL | INTRAMUSCULAR | Status: DC | PRN

## 2014-10-13 MED ORDER — POTASSIUM CHLORIDE CRYS ER 20 MEQ PO TBCR
40.0000 meq | EXTENDED_RELEASE_TABLET | Freq: Two times a day (BID) | ORAL | Status: AC
  Administered 2014-10-13 (×2): 40 meq via ORAL
  Filled 2014-10-13 (×2): qty 2

## 2014-10-13 NOTE — Progress Notes (Signed)
Notified by CCMD that patient's HR dropped to 40's. Responded to bedside; patient found sleeping with even, unlabored respirations at a rate of 14/min.  HR on telemetry at this time 52-56 bpm, regular rhythm; sinus bradycardia. No prior history of bradycardia listed, however, patient has had documented bradycardia this admission, prior to this event, at the same general rate (50s).  Patient received morphine 1mg  IV and xanax 1mg  PO at 19:38 per his request, for generalized pain and anxiety.  No previous note by MD concerning bradycardia; paged NP on-call to notify of possible new findings.  Not aroused at this time for further assessment. Will continue to monitor closely for overt signs of sedation/respiratory depression or other issues.

## 2014-10-13 NOTE — Progress Notes (Signed)
UR completed 
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
## 2014-10-13 NOTE — Progress Notes (Signed)
Echocardiogram 2D Echocardiogram has been performed.  Troy Terry 10/13/2014, 11:32 AM
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
## 2014-10-14 LAB — BASIC METABOLIC PANEL
ANION GAP: 10 (ref 5–15)
BUN: 7 mg/dL (ref 6–23)
CALCIUM: 8.4 mg/dL (ref 8.4–10.5)
CO2: 28 mEq/L (ref 19–32)
Chloride: 105 mEq/L (ref 96–112)
Creatinine, Ser: 0.92 mg/dL (ref 0.50–1.35)
GFR calc Af Amer: >90 mL/min (ref >90)
GFR, EST NON AFRICAN AMERICAN: 87 mL/min — AB (ref >90)
Glucose, Bld: 100 mg/dL — ABNORMAL HIGH (ref 70–99)
Potassium: 3.5 mEq/L — ABNORMAL LOW (ref 3.7–5.3)
Sodium: 143 mEq/L (ref 137–147)

## 2014-10-14 MED ORDER — POTASSIUM CHLORIDE CRYS ER 20 MEQ PO TBCR
60.0000 meq | EXTENDED_RELEASE_TABLET | Freq: Two times a day (BID) | ORAL | Status: DC
  Administered 2014-10-14: 60 meq via ORAL
  Filled 2014-10-14: qty 6

## 2014-10-14 MED ORDER — POTASSIUM CHLORIDE CRYS ER 20 MEQ PO TBCR: 40.0000 meq | EXTENDED_RELEASE_TABLET | Freq: Every day | ORAL | Status: AC

## 2014-10-14 MED ORDER — POTASSIUM CHLORIDE CRYS ER 20 MEQ PO TBCR
20.0000 meq | EXTENDED_RELEASE_TABLET | Freq: Once | ORAL | Status: AC
  Administered 2014-10-14: 20 meq via ORAL
  Filled 2014-10-14: qty 1
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
## 2024-04-29 DEATH — deceased
